# Patient Record
Sex: Male | Born: 1970 | Race: White | Hispanic: No | Marital: Married | Smoking: Never smoker
Health system: Southern US, Community
[De-identification: ages and names within clinical notes are randomized; demographics above are authoritative.]

## PROBLEM LIST (undated history)

## (undated) DIAGNOSIS — Z9889 Other specified postprocedural states: Secondary | ICD-10-CM

## (undated) DIAGNOSIS — M51369 Other intervertebral disc degeneration, lumbar region without mention of lumbar back pain or lower extremity pain: Secondary | ICD-10-CM

## (undated) DIAGNOSIS — E559 Vitamin D deficiency, unspecified: Secondary | ICD-10-CM

## (undated) DIAGNOSIS — K219 Gastro-esophageal reflux disease without esophagitis: Secondary | ICD-10-CM

## (undated) DIAGNOSIS — M5136 Other intervertebral disc degeneration, lumbar region: Secondary | ICD-10-CM

## (undated) HISTORY — DX: Other specified postprocedural states: Z98.890

## (undated) HISTORY — DX: Other intervertebral disc degeneration, lumbar region: M51.36

## (undated) HISTORY — DX: Gastro-esophageal reflux disease without esophagitis: K21.9

## (undated) HISTORY — PX: LUMBAR DISC SURGERY: SHX700

## (undated) HISTORY — DX: Vitamin D deficiency, unspecified: E55.9

## (undated) HISTORY — DX: Other intervertebral disc degeneration, lumbar region without mention of lumbar back pain or lower extremity pain: M51.369

---

## 2010-11-12 DIAGNOSIS — M545 Low back pain, unspecified: Secondary | ICD-10-CM | POA: Insufficient documentation

## 2012-11-19 ENCOUNTER — Emergency Department (HOSPITAL_COMMUNITY)
Admission: EM | Admit: 2012-11-19 | Discharge: 2012-11-20 | Disposition: A | Discharge status: Home / Self Care | Payer: BC Managed Care – PPO | Attending: Emergency Medicine | Admitting: Emergency Medicine

## 2012-11-19 DIAGNOSIS — N200 Calculus of kidney: Secondary | ICD-10-CM | POA: Insufficient documentation

## 2012-11-19 DIAGNOSIS — R110 Nausea: Secondary | ICD-10-CM | POA: Insufficient documentation

## 2012-11-19 LAB — CBC WITH DIFFERENTIAL/PLATELET
Basophils Absolute: 0 10*3/uL (ref 0.0–0.1)
Eosinophils Relative: 1 % (ref 0–5)
Lymphocytes Relative: 18 % (ref 12–46)
Lymphs Abs: 1.6 10*3/uL (ref 0.7–4.0)
MCV: 84.3 fL (ref 78.0–100.0)
Neutro Abs: 6.5 10*3/uL (ref 1.7–7.7)
Neutrophils Relative %: 73 % (ref 43–77)
Platelets: 219 10*3/uL (ref 150–400)
RBC: 4.52 MIL/uL (ref 4.22–5.81)
RDW: 13.1 % (ref 11.5–15.5)
WBC: 8.9 10*3/uL (ref 4.0–10.5)

## 2012-11-19 LAB — BASIC METABOLIC PANEL
CO2: 30 mEq/L (ref 19–32)
Calcium: 9.4 mg/dL (ref 8.4–10.5)
Potassium: 4.1 mEq/L (ref 3.5–5.1)
Sodium: 140 mEq/L (ref 135–145)

## 2012-11-19 LAB — URINE MICROSCOPIC-ADD ON

## 2012-11-19 LAB — URINALYSIS, ROUTINE W REFLEX MICROSCOPIC
Nitrite: NEGATIVE
Protein, ur: NEGATIVE mg/dL
Specific Gravity, Urine: 1.025 (ref 1.005–1.030)
Urobilinogen, UA: 0.2 mg/dL (ref 0.0–1.0)

## 2012-11-19 LAB — GRAM STAIN

## 2012-11-19 NOTE — ED Notes (Signed)
Pt states left flank pain. Denies difficulty voiding, hematuria. Last BM yesterday.

## 2012-11-19 NOTE — ED Provider Notes (Signed)
History     CSN: [id number]  Arrival date & time 11/19/12  1947   First MD Initiated Contact with Patient 11/19/12 2157      Chief Complaint  Patient presents with  . Flank Pain    HPI Guy Williams is a 42 y.o. male who presents to the emergency department with complaint of left flank pain. Reports of this colicky. Initially started this morning around 8 AM it occurred for approximately 2 hours. No pain until approximately 2 hours prior to arrival when pain started again. Described as sharp. Located at left flank. Radiating around abdomen. Some nausea. No vomiting. No diarrhea. No other symptoms.  Past medical history: GERD, intermittent constipation    No past surgical history on file.  Family History: Reviewed.  None pertinent.     History  Substance Use Topics  . Smoking status: Not on file  . Smokeless tobacco: Not on file  . Alcohol Use: Not on file      Review of Systems  Constitutional: Negative for fever and chills.  HENT: Negative for congestion, sore throat and neck pain.   Respiratory: Negative for cough.   Gastrointestinal: Positive for nausea and abdominal pain. Negative for vomiting, diarrhea and constipation.  Endocrine: Negative for polyuria.  Genitourinary: Negative for dysuria and hematuria.  Skin: Negative for rash.  Neurological: Negative for headaches.  Psychiatric/Behavioral: Negative.   All other systems reviewed and are negative.    Allergies  Review of patient's allergies indicates not on file.  Home Medications  No current outpatient prescriptions on file.  BP 142/76  Pulse 75  Temp(Src) 98.2 F (36.8 C)  Resp 18  SpO2 95%  Physical Exam  Nursing note and vitals reviewed. Constitutional: He is oriented to person, place, and time. He appears well-developed and well-nourished. No distress.  HENT:  Head: Normocephalic and atraumatic.  Right Ear: External ear normal.  Left Ear: External ear normal.  Mouth/Throat: Oropharynx  is clear and moist. No oropharyngeal exudate.  Eyes: Conjunctivae are normal. Pupils are equal, round, and reactive to light. Right eye exhibits no discharge.  Neck: Normal range of motion. Neck supple. No tracheal deviation present.  Cardiovascular: Normal rate, regular rhythm and intact distal pulses.   Pulmonary/Chest: Effort normal. No respiratory distress. He has no wheezes. He has no rales.  Abdominal: Soft. He exhibits no distension. There is no tenderness. There is CVA tenderness (L sided). There is no rebound and no guarding.  Musculoskeletal: Normal range of motion.  Neurological: He is alert and oriented to person, place, and time.  Skin: Skin is warm and dry. No rash noted. He is not diaphoretic.  Psychiatric: He has a normal mood and affect.    ED Course  Procedures (including critical care time)  Labs Reviewed  URINALYSIS, ROUTINE W REFLEX MICROSCOPIC   Ct Abdomen Pelvis Wo Contrast  11/20/2012  *RADIOLOGY REPORT*  Clinical Data: Left flank painhematuria  CT ABDOMEN AND PELVIS WITHOUT CONTRAST  Technique:  Multidetector CT imaging of the abdomen and pelvis was performed following the standard protocol without intravenous contrast.  Comparison: None  Findings:  Renal:  There is mild hydronephrosis and hydroureter of the left renal collecting system secondary to a partially obstructing calculus at the distal left ureter measuring 3 mm (image 89).  This calculus is 2-3 mm from the vesicoureteral junction.  No additional calculi noted within the left or right kidney.  No right ureterolithiasis.  The distal left ureteral calculus is faintly evident on the CT  scout.  Lung bases are clear.  No pericardial fluid.  No focal hepatic lesion.  The gallbladder, pancreas, spleen, adrenal glands are normal.  The stomach, small bowel, appendix, and cecum are normal.  The colon and rectosigmoid colon are normal.  Prostate gland is normal.  No pelvic lymphadenopathy. Review of bone windows demonstrates  no aggressive osseous lesions.  IMPRESSION:  1.  Partially obstructing calculus within the distal left ureter with mild hydronephrosis on the left. 2.  No nephrolithiasis.   Original Report Authenticated By: Genevive Bi, M.D.      1. Kidney stone      MDM   42 year old male presents to the emergency department with intermittent left colicky flank pain. No history of kidney stones. History and exam consistent with kidney stones. Labs and urine ordered. Noncontrast CT scan revealing small left sided partially obstructing stone. Patient's pain controlled with intramuscular Dilaudid. Discussed findings with the patient. Strainer and urine cup provided. Recommend treating pain with Percocet and daily Flomax provided. Patient safe for discharge. Doubt other serious intra-abdominal cause of pain. Patient discharged.       Arloa Koh, MD 11/20/12 [phone]

## 2012-11-19 NOTE — ED Notes (Signed)
Pt giving urine sample at this time.

## 2012-11-19 NOTE — ED Notes (Signed)
Pt ambulated to room with quick, steady gait.

## 2012-11-19 NOTE — ED Notes (Addendum)
Pt c/o sharp left flank pain that began this am. Denies any urinary symptoms. Denies any injury.

## 2012-11-20 ENCOUNTER — Emergency Department (HOSPITAL_COMMUNITY): Payer: BC Managed Care – PPO

## 2012-11-20 MED ORDER — TAMSULOSIN HCL 0.4 MG PO CAPS: 0.4000 mg | ORAL_CAPSULE | Freq: Every day | ORAL | Status: DC

## 2012-11-20 MED ORDER — HYDROMORPHONE HCL PF 1 MG/ML IJ SOLN: 1.0000 mg | Freq: Once | INTRAMUSCULAR | Status: AC

## 2012-11-20 MED ORDER — HYDROMORPHONE HCL PF 1 MG/ML IJ SOLN
INTRAMUSCULAR | Status: AC
  Administered 2012-11-20: 1 mg via INTRAVENOUS
  Filled 2012-11-20: qty 1

## 2012-11-20 MED ORDER — OXYCODONE-ACETAMINOPHEN 5-325 MG PO TABS: 1.0000 | ORAL_TABLET | ORAL | Status: DC | PRN

## 2012-11-20 NOTE — ED Provider Notes (Signed)
I saw and evaluated the patient, reviewed the resident's note and I agree with the findings and plan.   .Face to face Exam:  General:  Awake HEENT:  Atraumatic Resp:  Normal effort Abd:  Nondistended Neuro:No focal weakness Lymph: No adenopathy  Nelia Shi, MD 11/20/12 2342

## 2012-11-21 LAB — URINE CULTURE: Culture: NO GROWTH

## 2013-01-25 DIAGNOSIS — K219 Gastro-esophageal reflux disease without esophagitis: Secondary | ICD-10-CM | POA: Insufficient documentation

## 2013-01-25 DIAGNOSIS — K529 Noninfective gastroenteritis and colitis, unspecified: Secondary | ICD-10-CM | POA: Insufficient documentation

## 2013-07-12 ENCOUNTER — Ambulatory Visit
Admission: RE | Admit: 2013-07-12 | Discharge: 2013-07-12 | Disposition: A | Discharge status: Home / Self Care | Payer: BC Managed Care – PPO | Source: Ambulatory Visit | Attending: Chiropractic Medicine | Admitting: Chiropractic Medicine

## 2013-07-12 ENCOUNTER — Other Ambulatory Visit: Payer: Self-pay | Admitting: Chiropractic Medicine

## 2013-07-12 DIAGNOSIS — M545 Low back pain, unspecified: Secondary | ICD-10-CM

## 2014-06-19 ENCOUNTER — Other Ambulatory Visit: Payer: Self-pay | Admitting: Chiropractic Medicine

## 2014-06-19 DIAGNOSIS — M25511 Pain in right shoulder: Secondary | ICD-10-CM

## 2014-07-01 ENCOUNTER — Ambulatory Visit
Admission: RE | Admit: 2014-07-01 | Discharge: 2014-07-01 | Disposition: A | Discharge status: Home / Self Care | Payer: BC Managed Care – PPO | Source: Ambulatory Visit | Attending: Chiropractic Medicine | Admitting: Chiropractic Medicine

## 2014-07-01 DIAGNOSIS — M25511 Pain in right shoulder: Secondary | ICD-10-CM

## 2019-09-03 DIAGNOSIS — E739 Lactose intolerance, unspecified: Secondary | ICD-10-CM | POA: Insufficient documentation

## 2019-09-04 ENCOUNTER — Other Ambulatory Visit: Payer: Self-pay

## 2019-09-04 ENCOUNTER — Ambulatory Visit: Payer: 59 | Attending: Internal Medicine

## 2019-09-04 DIAGNOSIS — Z20822 Contact with and (suspected) exposure to covid-19: Secondary | ICD-10-CM

## 2019-09-05 LAB — NOVEL CORONAVIRUS, NAA: SARS-CoV-2, NAA: NOT DETECTED

## 2020-10-30 ENCOUNTER — Other Ambulatory Visit: Payer: Self-pay | Admitting: Chiropractic Medicine

## 2020-10-30 DIAGNOSIS — M545 Low back pain, unspecified: Secondary | ICD-10-CM

## 2020-11-01 ENCOUNTER — Other Ambulatory Visit: Payer: Self-pay

## 2020-11-01 ENCOUNTER — Ambulatory Visit
Admission: RE | Admit: 2020-11-01 | Discharge: 2020-11-01 | Disposition: A | Discharge status: Home / Self Care | Payer: 59 | Source: Ambulatory Visit | Attending: Chiropractic Medicine | Admitting: Chiropractic Medicine

## 2020-11-01 DIAGNOSIS — M545 Low back pain, unspecified: Secondary | ICD-10-CM

## 2020-11-13 ENCOUNTER — Other Ambulatory Visit: Payer: 59

## 2020-11-25 ENCOUNTER — Other Ambulatory Visit: Payer: Self-pay | Admitting: Sports Medicine

## 2020-11-25 DIAGNOSIS — G8929 Other chronic pain: Secondary | ICD-10-CM

## 2020-11-28 ENCOUNTER — Ambulatory Visit
Admission: RE | Admit: 2020-11-28 | Discharge: 2020-11-28 | Disposition: A | Discharge status: Home / Self Care | Payer: 59 | Source: Ambulatory Visit | Attending: Sports Medicine | Admitting: Sports Medicine

## 2020-11-28 ENCOUNTER — Other Ambulatory Visit: Payer: Self-pay

## 2020-11-28 DIAGNOSIS — G8929 Other chronic pain: Secondary | ICD-10-CM

## 2020-11-28 MED ORDER — IOPAMIDOL (ISOVUE-M 200) INJECTION 41%
1.0000 mL | Freq: Once | INTRAMUSCULAR | Status: AC
  Administered 2020-11-28: 1 mL via EPIDURAL

## 2020-11-28 MED ORDER — METHYLPREDNISOLONE ACETATE 40 MG/ML INJ SUSP (RADIOLOG
120.0000 mg | Freq: Once | INTRAMUSCULAR | Status: AC
  Administered 2020-11-28: 120 mg via EPIDURAL

## 2020-11-28 NOTE — Discharge Instructions (Signed)

## 2021-02-04 ENCOUNTER — Other Ambulatory Visit: Payer: Self-pay | Admitting: Sports Medicine

## 2021-02-04 ENCOUNTER — Other Ambulatory Visit: Payer: Self-pay | Admitting: Family Medicine

## 2021-02-04 DIAGNOSIS — M545 Low back pain, unspecified: Secondary | ICD-10-CM

## 2021-02-13 ENCOUNTER — Other Ambulatory Visit: Payer: Self-pay

## 2021-02-13 ENCOUNTER — Ambulatory Visit
Admission: RE | Admit: 2021-02-13 | Discharge: 2021-02-13 | Disposition: A | Discharge status: Home / Self Care | Payer: 59 | Source: Ambulatory Visit | Attending: Sports Medicine | Admitting: Sports Medicine

## 2021-02-13 DIAGNOSIS — M545 Low back pain, unspecified: Secondary | ICD-10-CM

## 2021-02-13 MED ORDER — IOPAMIDOL (ISOVUE-M 200) INJECTION 41%
1.0000 mL | Freq: Once | INTRAMUSCULAR | Status: AC
  Administered 2021-02-13: 1 mL via EPIDURAL

## 2021-02-13 MED ORDER — METHYLPREDNISOLONE ACETATE 40 MG/ML INJ SUSP (RADIOLOG
80.0000 mg | Freq: Once | INTRAMUSCULAR | Status: AC
  Administered 2021-02-13: 80 mg via EPIDURAL

## 2021-02-13 NOTE — Discharge Instructions (Signed)
Post Procedure Spinal Discharge Instruction Sheet  1. You may resume a regular diet and any medications that you routinely take (including pain medications).  2. No driving day of procedure.  3. Light activity throughout the rest of the day.  Do not do any strenuous work, exercise, bending or lifting.  The day following the procedure, you can resume normal physical activity but you should refrain from exercising or physical therapy for at least three days thereafter.   Common Side Effects:   Headaches- take your usual medications as directed by your physician.  Increase your fluid intake.  Caffeinated beverages may be helpful.  Lie flat in bed until your headache resolves.   Restlessness or inability to sleep- you may have trouble sleeping for the next few days.  Ask your referring physician if you need any medication for sleep.   Facial flushing or redness- should subside within a few days.   Increased pain- a temporary increase in pain a day or two following your procedure is not unusual.  Take your pain medication as prescribed by your referring physician.   Leg cramps  Please contact our office at [phone] for the following symptoms:  Fever greater than 100 degrees.  Headaches unresolved with medication after 2-3 days.  Increased swelling, pain, or redness at injection site.   Thank you for visiting [facility] today.  

## 2021-04-01 ENCOUNTER — Other Ambulatory Visit: Payer: Self-pay | Admitting: Sports Medicine

## 2021-04-01 DIAGNOSIS — G8929 Other chronic pain: Secondary | ICD-10-CM

## 2021-04-09 ENCOUNTER — Ambulatory Visit
Admission: RE | Admit: 2021-04-09 | Discharge: 2021-04-09 | Disposition: A | Discharge status: Home / Self Care | Payer: 59 | Source: Ambulatory Visit | Attending: Sports Medicine | Admitting: Sports Medicine

## 2021-04-09 ENCOUNTER — Other Ambulatory Visit: Payer: Self-pay

## 2021-04-09 DIAGNOSIS — G8929 Other chronic pain: Secondary | ICD-10-CM

## 2021-04-09 MED ORDER — METHYLPREDNISOLONE ACETATE 40 MG/ML INJ SUSP (RADIOLOG
80.0000 mg | Freq: Once | INTRAMUSCULAR | Status: AC
  Administered 2021-04-09: 80 mg via EPIDURAL

## 2021-04-09 MED ORDER — IOPAMIDOL (ISOVUE-M 200) INJECTION 41%
1.0000 mL | Freq: Once | INTRAMUSCULAR | Status: AC
  Administered 2021-04-09: 1 mL via EPIDURAL

## 2021-04-09 NOTE — Discharge Instructions (Signed)

## 2022-03-08 DIAGNOSIS — M5416 Radiculopathy, lumbar region: Secondary | ICD-10-CM | POA: Insufficient documentation

## 2022-03-08 DIAGNOSIS — M5126 Other intervertebral disc displacement, lumbar region: Secondary | ICD-10-CM | POA: Insufficient documentation

## 2022-03-09 DIAGNOSIS — B079 Viral wart, unspecified: Secondary | ICD-10-CM | POA: Insufficient documentation

## 2022-11-10 ENCOUNTER — Encounter: Payer: Self-pay | Admitting: Internal Medicine

## 2022-12-14 ENCOUNTER — Ambulatory Visit: Payer: 59 | Admitting: Internal Medicine

## 2023-01-21 ENCOUNTER — Other Ambulatory Visit: Payer: Self-pay

## 2023-01-25 ENCOUNTER — Other Ambulatory Visit: Payer: 59

## 2023-01-25 ENCOUNTER — Encounter: Payer: Self-pay | Admitting: Gastroenterology

## 2023-01-25 ENCOUNTER — Ambulatory Visit (INDEPENDENT_AMBULATORY_CARE_PROVIDER_SITE_OTHER): Payer: 59 | Admitting: Gastroenterology

## 2023-01-25 VITALS — BP 126/82 | HR 77 | Ht 72.0 in | Wt 216.4 lb

## 2023-01-25 DIAGNOSIS — K580 Irritable bowel syndrome with diarrhea: Secondary | ICD-10-CM

## 2023-01-25 DIAGNOSIS — R12 Heartburn: Secondary | ICD-10-CM | POA: Diagnosis not present

## 2023-01-25 DIAGNOSIS — Z1211 Encounter for screening for malignant neoplasm of colon: Secondary | ICD-10-CM

## 2023-01-25 MED ORDER — NA SULFATE-K SULFATE-MG SULF 17.5-3.13-1.6 GM/177ML PO SOLN: 1.0000 | Freq: Once | ORAL | 0 refills | Status: AC

## 2023-01-25 NOTE — Progress Notes (Signed)
[facility] Consult Note:  History: Guy Williams 01/25/2023  Referring provider: Benny Lennert, PA  Reason for consult/chief complaint: Gastroesophageal Reflux   Subjective  HPI:  This is a very pleasant 52 year old man referred by primary care for reevaluation of reflux.  [place] had upper endoscopy and colonoscopy for chronic heartburn as well as abdominal bloating with urgency for loose BMs.  He was put on a PPI, initially Prilosec and later changed to Protonix that seems to control his heartburn generally well.  He has intermittent food triggers such as tomato sauce or spicy foods.  Denies dysphagia, odynophagia, vomiting, loss of appetite or weight loss.  He has done his best to engage in diet and lifestyle antireflux measures. As a separate issue, he has long had tendencies to loose stool with certain food triggers including lactose.  Sometimes he cannot identify the clear trigger for the episodes, and he does not know if he is ever been tested for celiac.  No rectal bleeding and no prior colorectal cancer screening.   ROS:  Review of Systems  Constitutional:  Negative for appetite change and unexpected weight change.  HENT:  Negative for mouth sores and voice change.   Eyes:  Negative for pain and redness.  Respiratory:  Negative for cough and shortness of breath.   Cardiovascular:  Negative for chest pain and palpitations.  Genitourinary:  Negative for dysuria and hematuria.  Musculoskeletal:  Negative for arthralgias and myalgias.  Skin:  Negative for pallor and rash.  Neurological:  Negative for weakness and headaches.  Hematological:  Negative for adenopathy.     Past Medical History: Past Medical History:  Diagnosis Date   DDD (degenerative disc disease), lumbar    GERD (gastroesophageal reflux disease)    S/P lumbar discectomy    Vitamin D deficiency      Past Surgical History: Past Surgical  History:  Procedure Laterality Date   LUMBAR DISC SURGERY       Family History: Family History  Problem Relation Age of Onset   COPD Father    Diabetes Maternal Grandmother    Colon cancer Neg Hx    Esophageal cancer Neg Hx    Stomach cancer Neg Hx    No known family history of IBD or colon cancer.  Social History: Social History   Socioeconomic History   Marital status: Married    Spouse name: Not on file   Number of children: 2   Years of education: Not on file   Highest education level: Not on file  Occupational History   Occupation: Art gallery manager  Tobacco Use   Smoking status: Never   Smokeless tobacco: Never  Vaping Use   Vaping Use: Never used  Substance and Sexual Activity   Alcohol use: Yes    Comment: occasional   Drug use: Never   Sexual activity: Not on file  Other Topics Concern   Not on file  Social History Narrative   Not on file   Social Determinants of Health   Financial Resource Strain: Not on file  Food Insecurity: Not on file  Transportation Needs: Not on file  Physical Activity: Not on file  Stress: Not on file  Social Connections: Not on file   [facility] [place], and he is an Acupuncturist with [employer]  Allergies: Allergies  Allergen Reactions   Penicillins Rash    Outpatient Meds: Current Outpatient Medications  Medication Sig Dispense  Refill   Ascorbic Acid (VITAMIN C PO) Take 1 tablet by mouth every morning.     ascorbic acid (VITAMIN C) 100 MG tablet Take by mouth.     Cholecalciferol 50 MCG (2000 UT) TABS Take by mouth.     ibuprofen (ADVIL,MOTRIN) 200 MG tablet Take 600 mg by mouth 2 (two) times daily as needed for pain. For pain     loperamide (IMODIUM) 2 MG capsule Take 2 mg by mouth 4 (four) times daily as needed for diarrhea or loose stools. For diarrhea     Multiple Vitamin (MULTIVITAMIN ADULT PO) Take by mouth.     Multiple Vitamin (MULTIVITAMIN WITH MINERALS) TABS Take 1 tablet by mouth every  morning.     Na Sulfate-K Sulfate-Mg Sulf 17.5-3.13-1.6 GM/177ML SOLN Take 1 kit by mouth once for 1 dose. 354 mL 0   pantoprazole (PROTONIX) 40 MG tablet TAKE 1 TABLET(40 MG) BY MOUTH DAILY     No current facility-administered medications for this visit.      ___________________________________________________________________ Objective   Exam:  BP 126/82   Pulse 77   Ht 6' (1.829 m)   Wt 216 lb 6.4 oz (98.2 kg)   SpO2 98%   BMI 29.35 kg/m  Wt Readings from Last 3 Encounters:  01/25/23 216 lb 6.4 oz (98.2 kg)    General: Well-appearing, normal vocal quality Eyes: sclera anicteric, no redness ENT: oral mucosa moist without lesions, no cervical or supraclavicular lymphadenopathy CV: Regular without appreciable murmur, no JVD, no peripheral edema Resp: clear to auscultation bilaterally, normal RR and effort noted GI: soft, no tenderness, with active bowel sounds. No guarding or palpable organomegaly noted. Skin; warm and dry, no rash or jaundice noted Neuro: awake, alert and oriented x 3. Normal gross motor function and fluent speech  Labs:  No data to review.  Assessment: Encounter Diagnoses  Name Primary?   Heartburn Yes   Special screening for malignant neoplasms, colon    Irritable bowel syndrome with diarrhea     Longstanding heartburn suggestive of GERD.  No dysphagia, so seems less likely EOE unless it was a PPI responsive case of that.  We discussed the utility endoscopic evaluation to screen for Barrett's, assess for any erosive esophagitis that may occur despite PPI therapy, and then further discussion about the best long-term treatment (i.e. medicine versus surgical/endoscopic antireflux procedure).  Average risk for colorectal cancer, screening colonoscopy recommended as well.  Plan:  He was agreeable to an upper endoscopy and a colonoscopy after discussion of procedure and risks.  The benefits and risks of the planned procedure were described in detail  with the patient or (when appropriate) their health care proxy.  Risks were outlined as including, but not limited to, bleeding, infection, perforation, adverse medication reaction leading to cardiac or pulmonary decompensation, pancreatitis (if ERCP).  The limitation of incomplete mucosal visualization was also discussed.  No guarantees or warranties were given.  Celiac antibody also drawn today  Thank you for the courtesy of this consult.  Please call me with any questions or concerns.  Charlie Pitter III  CC: Referring provider noted above

## 2023-01-25 NOTE — Patient Instructions (Signed)
If your blood pressure at your visit was 140/90 or greater, please contact your primary care physician to follow up on this. ______________________________________________________  If you are age 50 or older, your body mass index should be between 23-30. Your Body mass index is 29.35 kg/m. If this is out of the aforementioned range listed, please consider follow up with your Primary Care Provider.  If you are age 17 or younger, your body mass index should be between 19-25. Your Body mass index is 29.35 kg/m. If this is out of the aformentioned range listed, please consider follow up with your Primary Care Provider.  ________________________________________________________  The [facility] providers would like to encourage you to use [facility] to communicate with providers for non-urgent requests or questions.  Due to long hold times on the telephone, sending your provider a message by [facility] may be a faster and more efficient way to get a response.  Please allow 48 business hours for a response.  Please remember that this is for non-urgent requests.  _______________________________________________________  Due to recent changes in healthcare laws, you may see the results of your imaging and laboratory studies on MyChart before your provider has had a chance to review them.  We understand that in some cases there may be results that are confusing or concerning to you. Not all laboratory results come back in the same time frame and the provider may be waiting for multiple results in order to interpret others.  Please give [place] 48 hours in order for your provider to thoroughly review all the results before contacting the office for clarification of your results.   Please go to the lab in the basement of our building to have lab work done as you leave today. Hit "B" for basement when you get on the elevator.  When the doors open the lab is on your left.  We will call you with the results. Thank you.  It was  a pleasure to see you today!  Thank you for trusting me with your gastrointestinal care!

## 2023-01-26 LAB — IGA: Immunoglobulin A: 391 mg/dL — ABNORMAL HIGH (ref 47–310)

## 2023-01-26 LAB — TISSUE TRANSGLUTAMINASE, IGA: (tTG) Ab, IgA: <1 U/mL

## 2023-02-22 ENCOUNTER — Encounter: Payer: Self-pay | Admitting: Gastroenterology

## 2023-03-08 ENCOUNTER — Ambulatory Visit (AMBULATORY_SURGERY_CENTER): Payer: 59 | Admitting: Gastroenterology

## 2023-03-08 ENCOUNTER — Encounter: Payer: Self-pay | Admitting: Gastroenterology

## 2023-03-08 VITALS — BP 133/79 | HR 62 | Temp 97.3°F | Resp 13 | Ht 72.0 in | Wt 216.0 lb

## 2023-03-08 DIAGNOSIS — K317 Polyp of stomach and duodenum: Secondary | ICD-10-CM | POA: Diagnosis not present

## 2023-03-08 DIAGNOSIS — Z1211 Encounter for screening for malignant neoplasm of colon: Secondary | ICD-10-CM | POA: Diagnosis not present

## 2023-03-08 DIAGNOSIS — R12 Heartburn: Secondary | ICD-10-CM

## 2023-03-08 MED ORDER — SODIUM CHLORIDE 0.9 % IV SOLN
500.0000 mL | INTRAVENOUS | Status: DC
Start: 2023-03-08 — End: 2023-03-08

## 2023-03-08 NOTE — Progress Notes (Signed)
Pt's states no medical or surgical changes since previsit or office visit. 

## 2023-03-08 NOTE — Op Note (Signed)
[facility] Patient Name: Guy Williams Procedure Date: 03/08/2023 1:03 PM MRN: [id number] Endoscopist: Sherilyn Cooter L. Myrtie Neither , MD, [id number] Age: 52 Referring MD:  Date of Birth: [birth year] Gender: Male Account #: [id number] Procedure:                Colonoscopy Indications:              Screening for colorectal malignant neoplasm, This                            is the patient's first colonoscopy Medicines:                Monitored Anesthesia Care Procedure:                Pre-Anesthesia Assessment:                           - Prior to the procedure, a History and Physical                            was performed, and patient medications and                            allergies were reviewed. The patient's tolerance of                            previous anesthesia was also reviewed. The risks                            and benefits of the procedure and the sedation                            options and risks were discussed with the patient.                            All questions were answered, and informed consent                            was obtained. Prior Anticoagulants: The patient has                            taken no anticoagulant or antiplatelet agents. ASA                            Grade Assessment: II - A patient with mild systemic                            disease. After reviewing the risks and benefits,                            the patient was deemed in satisfactory condition to                            undergo the procedure.  After obtaining informed consent, the colonoscope                            was passed under direct vision. Throughout the                            procedure, the patient's blood pressure, pulse, and                            oxygen saturations were monitored continuously. The                            CF [id number] #[id number] was introduced through the anus                            and advanced to the  the cecum, identified by                            appendiceal orifice and ileocecal valve. The                            colonoscopy was somewhat difficult due to a                            redundant colon and significant looping. Successful                            completion of the procedure was aided by using                            manual pressure and straightening and shortening                            the scope to obtain bowel loop reduction. The                            patient tolerated the procedure well. The quality                            of the bowel preparation was good after lavage of                            opaque liquid throughout the colon. The ileocecal                            valve, appendiceal orifice, and rectum were                            photographed. Scope In: 2:39:04 PM Scope Out: 2:57:01 PM Scope Withdrawal Time: 0 hours 11 minutes 15 seconds  Total Procedure Duration: 0 hours 17 minutes 57 seconds  Findings:                 The perianal and digital rectal examinations were  normal.                           Repeat examination of right colon under NBI                            performed.                           The colon (entire examined portion) was redundant.                           The exam was otherwise without abnormality on                            direct and retroflexion views. Complications:            No immediate complications. Estimated Blood Loss:     Estimated blood loss: none. Impression:               - Redundant colon.                           - The examination was otherwise normal on direct                            and retroflexion views.                           - No specimens collected. Recommendation:           - Patient has a contact number available for                            emergencies. The signs and symptoms of potential                            delayed  complications were discussed with the                            patient. Return to normal activities tomorrow.                            Written discharge instructions were provided to the                            patient.                           - Resume previous diet.                           - Continue present medications.                           - Repeat colonoscopy in 10 years for screening  purposes.                           - See the other procedure note for documentation of                            additional recommendations. Tavin Vernet L. Myrtie Neither, MD 03/08/2023 3:07:29 PM This report has been signed electronically.

## 2023-03-08 NOTE — Progress Notes (Signed)
Uneventful anesthetic. Report to pacu rn. Vss. Care resumed by rn. 

## 2023-03-08 NOTE — Patient Instructions (Signed)
Discharge instructions given. Normal exam. Resume previous medications. YOU HAD AN ENDOSCOPIC PROCEDURE TODAY AT THE [facility]:   Refer to the procedure report that was given to you for any specific questions about what was found during the examination.  If the procedure report does not answer your questions, please call your gastroenterologist to clarify.  If you requested that your care partner not be given the details of your procedure findings, then the procedure report has been included in a sealed envelope for you to review at your convenience later.  YOU SHOULD EXPECT: Some feelings of bloating in the abdomen. Passage of more gas than usual.  Walking can help get rid of the air that was put into your GI tract during the procedure and reduce the bloating. If you had a lower endoscopy (such as a colonoscopy or flexible sigmoidoscopy) you may notice spotting of blood in your stool or on the toilet paper. If you underwent a bowel prep for your procedure, you may not have a normal bowel movement for a few days.  Please Note:  You might notice some irritation and congestion in your nose or some drainage.  This is from the oxygen used during your procedure.  There is no need for concern and it should clear up in a day or so.  SYMPTOMS TO REPORT IMMEDIATELY:  Following lower endoscopy (colonoscopy or flexible sigmoidoscopy):  Excessive amounts of blood in the stool  Significant tenderness or worsening of abdominal pains  Swelling of the abdomen that is new, acute  Fever of 100F or higher  Following upper endoscopy (EGD)  Vomiting of blood or coffee ground material  New chest pain or pain under the shoulder blades  Painful or persistently difficult swallowing  New shortness of breath  Fever of 100F or higher  Black, tarry-looking stools  For urgent or emergent issues, a gastroenterologist can be reached at any hour by calling [phone]. Do not use MyChart messaging for  urgent concerns.    DIET:  We do recommend a small meal at first, but then you may proceed to your regular diet.  Drink plenty of fluids but you should avoid alcoholic beverages for 24 hours.  ACTIVITY:  You should plan to take it easy for the rest of today and you should NOT DRIVE or use heavy machinery until tomorrow (because of the sedation medicines used during the test).    FOLLOW UP: Our staff will call the number listed on your records the next business day following your procedure.  We will call around 7:15- 8:00 am to check on you and address any questions or concerns that you may have regarding the information given to you following your procedure. If we do not reach you, we will leave a message.     If any biopsies were taken you will be contacted by phone or by letter within the next 1-3 weeks.  Please call us at [phone] if you have not heard about the biopsies in 3 weeks.    SIGNATURES/CONFIDENTIALITY: You and/or your care partner have signed paperwork which will be entered into your electronic medical record.  These signatures attest to the fact that that the information above on your After Visit Summary has been reviewed and is understood.  Full responsibility of the confidentiality of this discharge information lies with you and/or your care-partner.

## 2023-03-08 NOTE — Progress Notes (Signed)
History and Physical:  This patient presents for endoscopic testing for: Encounter Diagnoses  Name Primary?   Special screening for malignant neoplasms, colon    Heartburn Yes    Clinical details in 01/25/2023 office consult note.  Longstanding heartburn on chronic PPI, upper endoscopy for further evaluation today.  Average risk for colorectal cancer, screening colonoscopy.  No clinical changes since the recent office visit.  Patient is otherwise without complaints or active issues today.   Past Medical History: Past Medical History:  Diagnosis Date   DDD (degenerative disc disease), lumbar    GERD (gastroesophageal reflux disease)    S/P lumbar discectomy    Vitamin D deficiency      Past Surgical History: Past Surgical History:  Procedure Laterality Date   LUMBAR DISC SURGERY      Allergies: Allergies  Allergen Reactions   Penicillins Rash    Outpatient Meds: Current Outpatient Medications  Medication Sig Dispense Refill   Ascorbic Acid (VITAMIN C PO) Take 1 tablet by mouth every morning.     Cholecalciferol 50 MCG (2000 UT) TABS Take by mouth.     ibuprofen (ADVIL,MOTRIN) 200 MG tablet Take 600 mg by mouth 2 (two) times daily as needed for pain. For pain     loperamide (IMODIUM) 2 MG capsule Take 2 mg by mouth 4 (four) times daily as needed for diarrhea or loose stools. For diarrhea     Multiple Vitamin (MULTIVITAMIN ADULT PO) Take by mouth.     pantoprazole (PROTONIX) 40 MG tablet TAKE 1 TABLET(40 MG) BY MOUTH DAILY     Current Facility-Administered Medications  Medication Dose Route Frequency Provider Last Rate Last Admin   0.9 %  sodium chloride infusion  500 mL Intravenous Continuous Danis, Starr Lake III, MD          ___________________________________________________________________ Objective   Exam:  BP 139/88   Pulse 68   Temp (!) 97.3 F (36.3 C)   Ht 6' (1.829 m)   Wt 216 lb (98 kg)   SpO2 98%   BMI 29.29 kg/m   CV: regular , S1/S2 Resp:  clear to auscultation bilaterally, normal RR and effort noted GI: soft, no tenderness, with active bowel sounds.   Assessment: Encounter Diagnoses  Name Primary?   Special screening for malignant neoplasms, colon    Heartburn Yes     Plan: Colonoscopy EGD  The benefits and risks of the planned procedure were described in detail with the patient or (when appropriate) their health care proxy.  Risks were outlined as including, but not limited to, bleeding, infection, perforation, adverse medication reaction leading to cardiac or pulmonary decompensation, pancreatitis (if ERCP).  The limitation of incomplete mucosal visualization was also discussed.  No guarantees or warranties were given.  The patient is appropriate for an endoscopic procedure in the ambulatory setting.   - Amada Jupiter, MD

## 2023-03-08 NOTE — Op Note (Signed)
[facility] Patient Name: Guy Williams Procedure Date: 03/08/2023 1:04 PM MRN: [id number] Endoscopist: Sherilyn Cooter L. Myrtie Neither , MD, [id number] Age: 52 Referring MD:  Date of Birth: 06-29-1971 Gender: Male Account #: [id number] Procedure:                Upper GI endoscopy Indications:              Heartburn, Esophageal reflux symptoms that recur                            despite appropriate therapy Medicines:                Monitored Anesthesia Care Procedure:                Pre-Anesthesia Assessment:                           - Prior to the procedure, a History and Physical                            was performed, and patient medications and                            allergies were reviewed. The patient's tolerance of                            previous anesthesia was also reviewed. The risks                            and benefits of the procedure and the sedation                            options and risks were discussed with the patient.                            All questions were answered, and informed consent                            was obtained. Prior Anticoagulants: The patient has                            taken no anticoagulant or antiplatelet agents. ASA                            Grade Assessment: II - A patient with mild systemic                            disease. After reviewing the risks and benefits,                            the patient was deemed in satisfactory condition to                            undergo the procedure.  After obtaining informed consent, the endoscope was                            passed under direct vision. Throughout the                            procedure, the patient's blood pressure, pulse, and                            oxygen saturations were monitored continuously. The                            GIF [id number] #[id number] was introduced through the                            mouth, and advanced to the  second part of duodenum.                            The upper GI endoscopy was accomplished without                            difficulty. The patient tolerated the procedure                            well. Scope In: Scope Out: Findings:                 The larynx was normal.                           The esophagus was normal. Specifically, no                            esophagitis, stricture, Barrett's, dilatation or                            resistance passing the scope through the EG                            junction.                           Multiple sessile fundic gland polyps were found in                            the gastric fundus and in the gastric body. (No                            suspicious areas examined under WL and NBI)                           The exam of the stomach was otherwise normal.                           The cardia and gastric fundus were normal on  retroflexion. (Hill grade 1)                           The examined duodenum was normal. Complications:            No immediate complications. Estimated Blood Loss:     Estimated blood loss: none. Impression:               - Normal larynx.                           - Normal esophagus.                           - Multiple fundic gland polyps.                           - Normal examined duodenum.                           - No specimens collected. Recommendation:           - Patient has a contact number available for                            emergencies. The signs and symptoms of potential                            delayed complications were discussed with the                            patient. Return to normal activities tomorrow.                            Written discharge instructions were provided to the                            patient.                           - Resume previous diet.                           - Continue present medications.                            - See the other procedure note for documentation of                            additional recommendations.                           - Follow an antireflux regimen indefinitely. Focus                            on these diet and lifestyle changes with the goal  of de-escalating acid suppression therapy. Return                            to my office as needed, especially if reflux                            symptoms require regular use of acid suppression                            medicine, which time further workup could be                            pursued in consideration of TIF procedure. Guy Williams L. Myrtie Neither, MD 03/08/2023 3:04:40 PM This report has been signed electronically.

## 2023-03-09 ENCOUNTER — Telehealth: Payer: Self-pay | Admitting: *Deleted

## 2023-03-09 NOTE — Telephone Encounter (Signed)
  Follow up Call-     03/08/2023    1:07 PM  Call back number  Post procedure Call Back phone  # [phone]  Permission to leave phone message Yes   [facility]

## 2023-06-18 IMAGING — XA Imaging study
2 series · 2 of 2 positions shown · non-contrast
Comparison: none

Addendum:
CLINICAL DATA: Lumbosacral spondylosis without myelopathy. Left
lower back pain. Patient has experienced significant improvement
following prior left L4-L5 epidural steroid injections. He presents
for his third injection today. He has surgery scheduled for
[place].

[Series 1: ortho standard · 1 of 1 slices shown (1 of 2)]
[im 1/1]
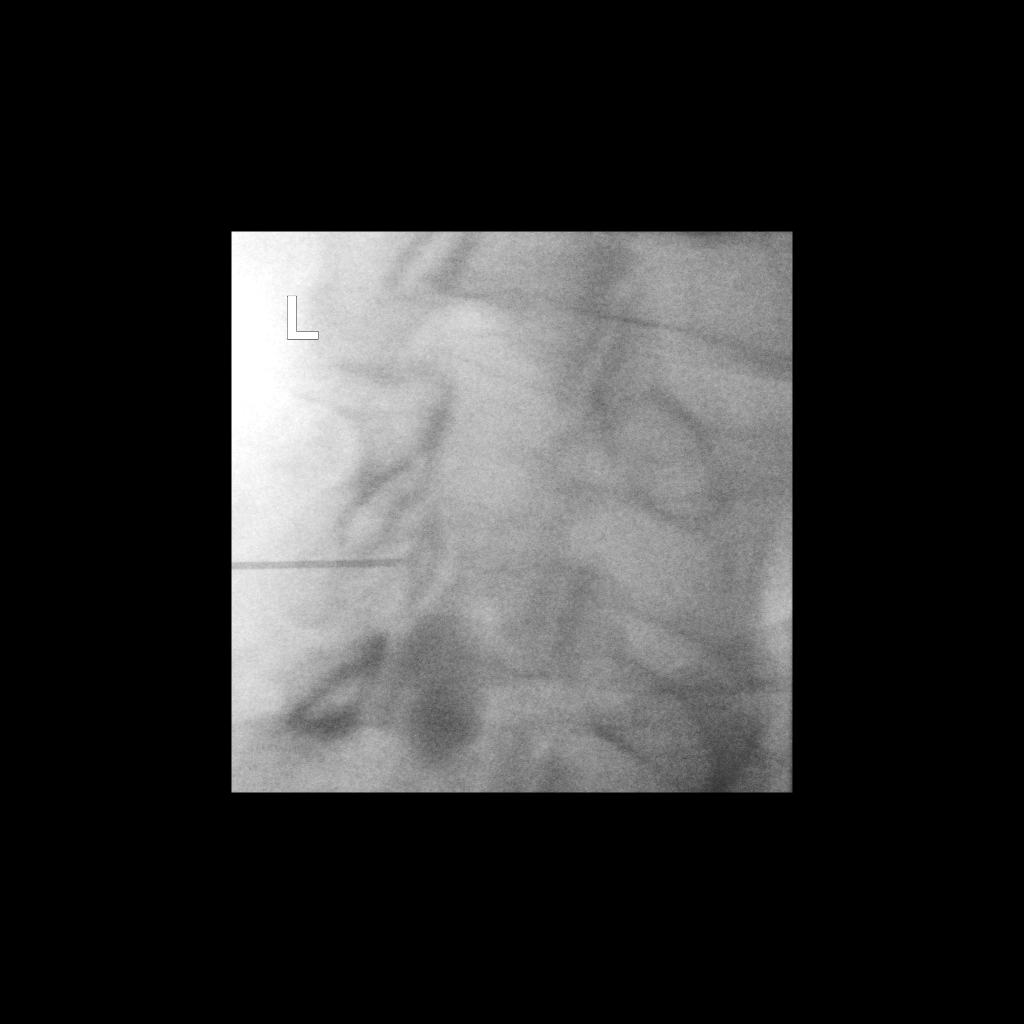

[Series 2: ortho standard · 1 of 1 slices shown (2 of 2)]
[im 1/1]
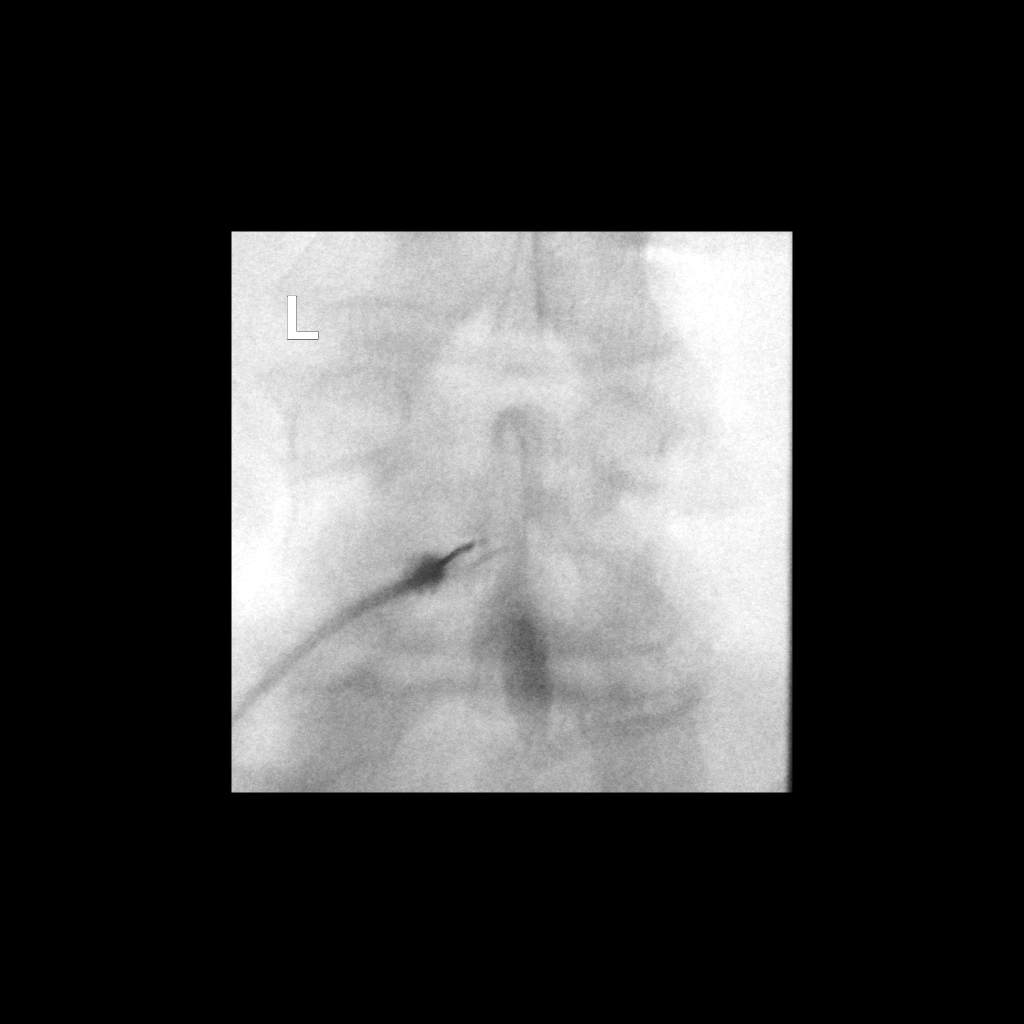

[2 of 2 positions shown; findings below may reference images not displayed]

FLUOROSCOPY TIME:  0 minutes 24 seconds 3.7 mGy

PROCEDURE:
The procedure, risks, benefits, and alternatives were explained to
the patient. Questions regarding the procedure were encouraged and
answered. The patient understands and consents to the procedure.

LUMBAR EPIDURAL INJECTION:

An interlaminar approach was performed on left at L4-L5. The
overlying skin was cleansed and anesthetized. A 20 gauge epidural
needle was advanced using loss-of-resistance technique.

DIAGNOSTIC EPIDURAL INJECTION:

Injection of Isovue-M 200 shows a good epidural pattern with spread
above and below the level of needle placement, primarily on the left
no vascular opacification is seen.

THERAPEUTIC EPIDURAL INJECTION:

40 mg of Depo-Medrol mixed with 2 mL 1% lidocaine were instilled.
The procedure was well-tolerated, and the patient was discharged
thirty minutes following the injection in good condition.

COMPLICATIONS:
None.
IMPRESSION: Technically successful epidural injection on the left L4-L5 #3.

ADDENDUM:
Correction: 80 mg DepoMedrol injected, not 40 mg.

*** End of Addendum ***
FLUOROSCOPY TIME:  0 minutes 24 seconds 3.7 mGy

PROCEDURE:
The procedure, risks, benefits, and alternatives were explained to
the patient. Questions regarding the procedure were encouraged and
answered. The patient understands and consents to the procedure.

LUMBAR EPIDURAL INJECTION:

An interlaminar approach was performed on left at L4-L5. The
overlying skin was cleansed and anesthetized. A 20 gauge epidural
needle was advanced using loss-of-resistance technique.

DIAGNOSTIC EPIDURAL INJECTION:

Injection of Isovue-M 200 shows a good epidural pattern with spread
above and below the level of needle placement, primarily on the left
no vascular opacification is seen.

THERAPEUTIC EPIDURAL INJECTION:

40 mg of Depo-Medrol mixed with 2 mL 1% lidocaine were instilled.
The procedure was well-tolerated, and the patient was discharged
thirty minutes following the injection in good condition.

COMPLICATIONS:
None.
IMPRESSION: Technically successful epidural injection on the left L4-L5 #3.

## 2023-12-14 ENCOUNTER — Other Ambulatory Visit: Payer: Self-pay | Admitting: Chiropractic Medicine

## 2023-12-14 DIAGNOSIS — M1612 Unilateral primary osteoarthritis, left hip: Secondary | ICD-10-CM

## 2024-01-05 ENCOUNTER — Ambulatory Visit
Admission: RE | Admit: 2024-01-05 | Discharge: 2024-01-05 | Disposition: A | Discharge status: Home / Self Care | Source: Ambulatory Visit | Attending: Chiropractic Medicine | Admitting: Chiropractic Medicine

## 2024-01-05 DIAGNOSIS — M1612 Unilateral primary osteoarthritis, left hip: Secondary | ICD-10-CM

## 2024-01-05 MED ORDER — IOPAMIDOL (ISOVUE-M 200) INJECTION 41%
10.0000 mL | Freq: Once | INTRAMUSCULAR | Status: AC
  Administered 2024-01-05: 10 mL via INTRA_ARTICULAR
